# Patient Record
Sex: Female | Born: 1985 | Race: Black or African American | Hispanic: No | Marital: Single | Smoking: Former smoker
Health system: Southern US, Community
[De-identification: ages and names within clinical notes are randomized; demographics above are authoritative.]

## PROBLEM LIST (undated history)

## (undated) ENCOUNTER — Inpatient Hospital Stay: Payer: Self-pay

## (undated) DIAGNOSIS — Z789 Other specified health status: Secondary | ICD-10-CM

## (undated) DIAGNOSIS — R87629 Unspecified abnormal cytological findings in specimens from vagina: Secondary | ICD-10-CM

## (undated) HISTORY — PX: OTHER SURGICAL HISTORY: SHX169

---

## 2006-07-13 ENCOUNTER — Inpatient Hospital Stay: Payer: Self-pay | Admitting: Obstetrics and Gynecology

## 2015-01-15 ENCOUNTER — Other Ambulatory Visit: Payer: Self-pay | Admitting: Advanced Practice Midwife

## 2015-01-15 DIAGNOSIS — Z369 Encounter for antenatal screening, unspecified: Secondary | ICD-10-CM

## 2015-01-31 ENCOUNTER — Ambulatory Visit: Payer: Self-pay

## 2015-02-04 ENCOUNTER — Ambulatory Visit
Admission: RE | Admit: 2015-02-04 | Discharge: 2015-02-04 | Disposition: A | Discharge status: Home / Self Care | Payer: Medicaid Other | Source: Ambulatory Visit | Attending: Advanced Practice Midwife | Admitting: Advanced Practice Midwife

## 2015-02-04 ENCOUNTER — Ambulatory Visit
Admission: RE | Admit: 2015-02-04 | Discharge: 2015-02-04 | Disposition: A | Discharge status: Home / Self Care | Payer: Medicaid Other | Source: Ambulatory Visit | Attending: Obstetrics & Gynecology | Admitting: Obstetrics & Gynecology

## 2015-02-04 DIAGNOSIS — Z369 Encounter for antenatal screening, unspecified: Secondary | ICD-10-CM

## 2015-02-04 DIAGNOSIS — Z36 Encounter for antenatal screening of mother: Secondary | ICD-10-CM | POA: Diagnosis not present

## 2015-02-04 HISTORY — DX: Other specified health status: Z78.9

## 2015-02-04 LAB — US OB COMP LESS 14 WKS

## 2015-02-04 NOTE — Progress Notes (Addendum)
[facility] Department Length of Consultation: 30 minutes   Stephanie Cantrell  was referred to [facility] for genetic counseling to review prenatal screening and testing options.  This note summarizes the information we discussed.    First trimester screening, which can include nuchal translucency ultrasound screen and/or first trimester maternal serum marker screening.  The nuchal translucency has approximately an 80% detection rate for Down syndrome and can be positive for other chromosome abnormalities as well as congenital heart defects.  When combined with a maternal serum marker screening, the detection rate is up to 90% for Down syndrome and up to 97% for trisomy 18.     Maternal serum marker screening, a blood test that measures pregnancy proteins, can provide risk assessments for Down syndrome, trisomy 18, and open neural tube defects (spina bifida, anencephaly). Because it does not directly examine the fetus, it cannot positively diagnose or rule out these problems.  Targeted ultrasound uses high frequency sound waves to create an image of the developing fetus.  An ultrasound is often recommended as a routine means of evaluating the pregnancy.  It is also used to screen for fetal anatomy problems (for example, a heart defect) that might be suggestive of a chromosomal or other abnormality.   Should these screening tests indicate an increased concern, then the following diagnostic options would be offered:  The chorionic villus sampling procedure is available for first trimester chromosome analysis.  This involves the withdrawal of a small amount of chorionic villi (tissue from the developing placenta).  Risk of pregnancy loss is estimated to be approximately 1 in 200 to 1 in 100 (0.5 to 1%).  There is approximately a 1% (1 in 100) chance that the CVS chromosome results will be unclear.  Chorionic villi cannot be tested for neural tube defects.     Amniocentesis  involves the removal of a small amount of amniotic fluid from the sac surrounding the fetus with the use of a thin needle inserted through the maternal abdomen and uterus.  Ultrasound guidance is used throughout the procedure.  Fetal cells from amniotic fluid are directly evaluated and > 99.5% of chromosome problems and > 98% of open neural tube defects can be detected. This procedure is generally performed after the 15th week of pregnancy.  The main risks to this procedure include complications leading to miscarriage in less than 1 in 200 cases (0.5%).  As another option for information if the pregnancy is suspected to be an an increased chance for certain chromosome conditions, we also reviewed the availability of cell free fetal DNA testing from maternal blood to determine whether or not the baby may have either Down syndrome, trisomy 68, or trisomy 58.  This test utilizes a maternal blood sample and DNA sequencing technology to isolate circulating cell free fetal DNA from maternal plasma.  The fetal DNA can then be analyzed for DNA sequences that are derived from the three most common chromosomes involved in aneuploidy, chromosomes 13, 18, and 21.  If the overall amount of DNA is greater than the expected level for any of these chromosomes, aneuploidy is suspected.  This testing is commercially available, and is able to provide another means of determining the chance for one of these common chromosome conditions, without requiring an invasive procedure and traditional karyotype analysis.  While this is new technology, the testing does show great promise, and we offered it as an option.  We discussed this option with her in detail.  We explained that while we do not consider it a replacement for invasive testing and karyotype analysis, a negative result from this testing would be reassuring, though not a guarantee of a normal chromosome complement for the baby.  An abnormal result is certainly suggestive of an  abnormal chromosome complement, though we would still recommend CVS or amniocentesis to confirm any findings from this testing.   Cystic Fibrosis screening was also discussed with the patient. Cystic fibrosis (CF) is one of the most common genetic conditions in persons of Caucasian ancestry.  This condition occurs in approximately 1 in 2,500 Caucasian persons and results in thickened secretions in the lungs, digestive, and reproductive systems.  For a baby to be at risk for having CF, both of the parents must be carriers for this condition.  Approximately 1 in 20 Caucasian persons is a carrier for CF.  Current carrier testing looks for the most common mutations in the gene for CF and can detect approximately 90% of carriers in the Caucasian population.  This means that the carrier screening can greatly reduce, but cannot eliminate, the chance for an individual to have a child with CF.  If an individual is found to be a carrier for CF, then carrier testing would be available for the partner. As part of [place]'s newborn screening profile, all babies born in the state of [place] will have a two-tier screening process.  Specimens are first tested to determine the concentration of immunoreactive trypsinogen (IRT).  The top 5% of specimens with the highest IRT values then undergo DNA testing using a panel of over 40 common CF mutations.   We obtained a detailed family history and pregnancy history.  The patient stated that her maternal grandmother has breast cancer in her 52s and some of her mother's cousins also have had breast cancer.  We reviewed that most cases of breast cancer, particularly after menopause, occur by chance alone.  However, there are some families in which genetic factors for breast cancer are strong.  If the family is concerned about this history, they could consider meeting with a cancer genetic counselor.   We suggest that the patient follow her doctor's recommendations  regarding self exams and mammography.  The patient also has a maternal aunt who was born with unilateral vision loss.  There may be many reasons for blindness, including those which are inherited and those that have environmental or other causes.  If a specific cause is known, we are happy to review medical records and discuss this history further. The remainder of the family history was reported to be unremarkable for birth defects, mental retardation, recurrent pregnancy loss or known chromosome abnormalities.  Stephanie Cantrell reported no complications or exposures in this pregnancy that are expected to increase the risk for birth defects. This is her second pregnancy with this partner.  They have a healthy 56 year old daughter.  After consideration of the options, Stephanie Cantrell elected to proceed with first trimester screening.  An ultrasound was performed at the time of the visit.  The gestational age was consistent with  12 weeks.  Fetal anatomy could not be assessed due to early gestational age.  Please refer to the ultrasound report for details of that study.  Stephanie Cantrell was encouraged to call with questions or concerns.  We can be contacted at [phone].  Stephanie Anderson, MS, CGC   I was immediately available for this consult. Stephanie Cantrell, [place] A, MD

## 2015-02-11 ENCOUNTER — Telehealth: Payer: Self-pay | Admitting: Obstetrics and Gynecology

## 2015-02-11 NOTE — Telephone Encounter (Signed)
  Ms. Mackintosh elected to undergo First Trimester screening on February 04, 2015.  To review, first trimester screening, includes nuchal translucency ultrasound screen and/or first trimester maternal serum marker screening.  The nuchal translucency has approximately an 80% detection rate for Down syndrome and can be positive for other chromosome abnormalities as well as heart defects.  When combined with a maternal serum marker screening, the detection rate is up to 90% for Down syndrome and up to 97% for trisomy 13 and 18.     The results of the First Trimester Nuchal Translucency and Biochemical Screening were within normal range.  The risk for Down syndrome is now estimated to be less than 1 in 10,000.  The risk for Trisomy 13/18 is also estimated to be less than 1 in 10,000.  Should more definitive information be desired, we would offer amniocentesis.  Because we do not yet know the effectiveness of combined first and second trimester screening, we do not recommend a maternal serum screen to assess the chance for chromosome conditions.  However, if screening for neural tube defects is desired, maternal serum screening for AFP only can be performed between 15 and [redacted] weeks gestation.

## 2015-03-07 NOTE — Addendum Note (Signed)
Encounter addended by: [place] Haillee Johann, MD on: 03/07/2015  8:28 AM<BR>     Documentation filed: Notes Section

## 2015-03-18 ENCOUNTER — Ambulatory Visit
Admission: RE | Admit: 2015-03-18 | Discharge: 2015-03-18 | Disposition: A | Discharge status: Home / Self Care | Payer: Medicaid Other | Source: Ambulatory Visit | Attending: Maternal & Fetal Medicine | Admitting: Maternal & Fetal Medicine

## 2015-03-18 DIAGNOSIS — O99321 Drug use complicating pregnancy, first trimester: Secondary | ICD-10-CM | POA: Diagnosis not present

## 2015-03-18 DIAGNOSIS — Z36 Encounter for antenatal screening of mother: Secondary | ICD-10-CM | POA: Insufficient documentation

## 2015-03-18 DIAGNOSIS — Z369 Encounter for antenatal screening, unspecified: Secondary | ICD-10-CM

## 2015-03-18 DIAGNOSIS — F1290 Cannabis use, unspecified, uncomplicated: Secondary | ICD-10-CM | POA: Insufficient documentation

## 2015-03-18 DIAGNOSIS — Z3A12 12 weeks gestation of pregnancy: Secondary | ICD-10-CM | POA: Insufficient documentation

## 2015-03-18 DIAGNOSIS — O99311 Alcohol use complicating pregnancy, first trimester: Secondary | ICD-10-CM | POA: Diagnosis not present

## 2015-06-11 ENCOUNTER — Encounter: Payer: Self-pay | Admitting: Obstetrics and Gynecology

## 2015-06-11 ENCOUNTER — Ambulatory Visit (INDEPENDENT_AMBULATORY_CARE_PROVIDER_SITE_OTHER): Payer: Medicaid Other | Admitting: Obstetrics and Gynecology

## 2015-06-11 VITALS — BP 108/72 | HR 80 | Ht 64.0 in | Wt 173.4 lb

## 2015-06-11 DIAGNOSIS — Z331 Pregnant state, incidental: Secondary | ICD-10-CM

## 2015-06-11 DIAGNOSIS — R87611 Atypical squamous cells cannot exclude high grade squamous intraepithelial lesion on cytologic smear of cervix (ASC-H): Secondary | ICD-10-CM

## 2015-06-11 DIAGNOSIS — Z3493 Encounter for supervision of normal pregnancy, unspecified, third trimester: Secondary | ICD-10-CM

## 2015-06-11 NOTE — Addendum Note (Signed)
Addended by: Sharon SellerEFRANCESCO, Jayland Null on: 06/11/2015 01:27 PM   Modules accepted: Level of Service

## 2015-06-11 NOTE — Progress Notes (Signed)
Patient ID: Stephanie Cantrell, female   DOB: 02/05/1986, 29 y.o.   MRN: [id number] Refer from [facility]- colpo 01/2015- ascus cannot exclude high grade g2 p1001  30 1/7  GYN ENCOUNTER NOTE  Subjective:       Stephanie Cantrell is a 66 y.o. G57P1001 female is here for gynecologic evaluation of the following issues:  1.ASCUS/H Pap smear.     Currently, [redacted] weeks gestation.  Past history is notable for abnormal Pap smear years ago for which she underwent colposcopy at the health department; no interventions for dysplasia were performed.  Patient is monogamous.  Greater than 10 years.  She is a nonsmoker over the past 10 years. Gynecologic History Patient's last menstrual period was 11/12/2014. Contraception: Gravid Last NWG:NFAOZ/H  Obstetric History OB History  Gravida Para Term Preterm AB SAB TAB Ectopic Multiple Living  # Outcome Date GA Lbr Len/2nd Weight Sex Delivery Anes PTL Lv  2 Current           1 Term 07/13/06     Vag-Spont         Past Medical History  Diagnosis Date  . Medical history non-contributory     Past Surgical History  Procedure Laterality Date  . None      Current Outpatient Prescriptions on File Prior to Visit  Medication Sig Dispense Refill  . ferrous fumarate (HEMOCYTE - 106 MG FE) 325 (106 FE) MG TABS tablet Take 1 tablet by mouth daily.    . Prenatal Vit-Fe Fumarate-FA (PRENATAL MULTIVITAMIN) TABS tablet Take 1 tablet by mouth daily at 12 noon.     No current facility-administered medications on file prior to visit.    No Known Allergies  Social History   Social History  . Marital Status: Single    Spouse Name: N/A  . Number of Children: N/A  . Years of Education: N/A   Occupational History  . Not on file.   Social History Main Topics  . Smoking status: Former Smoker    Quit date: 02/03/2005  . Smokeless tobacco: Never Used  . Alcohol Use: No  . Drug Use: No  . Sexual Activity: Yes   Other Topics Concern  . Not on file    Social History Narrative    Family History  Problem Relation Age of Onset  . Breast cancer Maternal Grandmother   . Diabetes Neg Hx   . Heart disease Neg Hx   . Colon cancer Neg Hx   . Ovarian cancer Other     The following portions of the patient's history were reviewed and updated as appropriate: allergies, current medications, past family history, past medical history, past social history, past surgical history and problem list.  Review of Systems Review of Systems - General ROS: negative for - chills, fatigue, fever, hot flashes, malaise or night sweats Hematological and Lymphatic ROS: negative for - bleeding problems or swollen lymph nodes Gastrointestinal ROS: negative for - abdominal pain, blood in stools, change in bowel habits and nausea/vomiting Musculoskeletal ROS: negative for - joint pain, muscle pain or muscular weakness Genito-Urinary ROS: negative for - change in menstrual cycle, dysmenorrhea, dyspareunia, dysuria, genital discharge, genital ulcers, hematuria, incontinence, irregular/heavy menses, nocturia or pelvic pain.  Objective:   BP 108/72 mmHg  Pulse 80  Ht  (1.626 m)  Wt 173 lb 6.4 oz (78.654 kg)  BMI 29.75 kg/m2  LMP 11/12/2014 CONSTITUTIONAL: Well-developed, well-nourished female in no acute distress.  HENT:  Normocephalic, atraumatic.  NECK: Normal range of motion, supple, no masses.  Normal thyroid.  SKIN: Skin is warm and dry. No rash noted. Not diaphoretic. No erythema. No pallor. NEUROLGIC: Alert and oriented to person, place, and time. PSYCHIATRIC: Normal mood and affect. Normal behavior. Normal judgment and thought content. CARDIOVASCULAR:Not Examined RESPIRATORY: Not Examined BREASTS: Not Examined ABDOMEN: Soft, non distended; Non tender.  No Organomegaly.Gravid, 30 weeks. PELVIC:  External Genitalia: Normal  BUS: Normal  Vagina: Normal  Cervix: Normal  Uterus: Nontender, 30 week size  Adnexa: Normal  RV: Normal   Bladder:  Nontender MUSCULOSKELETAL: Normal range of motion. No tenderness.  No cyanosis, clubbing, or edema.  COLPOSCOPY: SCJ visualized. Isolated acetowhite epithelium is noted at the 1:00 and 11:00 positions with tongues of abnormality extending from the SCJ (see geographic mapping for details)  Pap smear is taken. No biopsies performed.    Assessment:   1. Pap smear of cervix with ASCUS, cannot exclude HGSIL 2.  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  Pap smear/HPV. 2.  Return in May 2017 for repeat colposcopy with biopsies (12 weeks status post EDC) 3.  Avoid tobacco products. 4.  Remain monogamous and use condoms if new partners are encountered.  Herold HarmsMartin A Quasim Doyon, MD  Note: This dictation was prepared with Dragon dictation along with smaller phrase technology. Any transcriptional errors that result from this process are unintentional.   Cc[facility]

## 2015-06-11 NOTE — Patient Instructions (Signed)
1.  Return in May 2017 for repeat colposcopy with biopsies.

## 2015-06-13 ENCOUNTER — Encounter: Payer: Self-pay | Admitting: Obstetrics and Gynecology

## 2015-06-19 LAB — PAP IG AND HPV HIGH-RISK
HPV, high-risk: POSITIVE — AB
PAP SMEAR COMMENT: 0

## 2015-07-07 NOTE — L&D Delivery Note (Signed)
Delivery Summary for [employer]  Labor Events:   Preterm labor:   Rupture date:   Rupture time:   Rupture type:   Fluid Color:   Induction:   Augmentation:   Complications:   Cervical ripening:          Delivery:   Episiotomy:   Lacerations:   Repair suture:   Repair # of packets:   Blood loss (ml): 150   Information for the patient's newborn:  Larita, Deremer [[id number]]    Delivery 08/23/2015 4:54 AM by  Vaginal, Spontaneous Delivery Sex:  female Gestational Age: [redacted]w[redacted]d Delivery Clinician:  Hildred Laser Living?: Yes        APGARS  One minute Five minutes Ten minutes  Skin color: 1   1      Heart rate: 2   2      Grimace: 2   2      Muscle tone: 2   2      Breathing: 2   2      Totals: 9  9      Presentation/position:      Resuscitation: None  Cord information: 3 vessels   Disposition of cord blood:     Blood gases sent?  Complications:   Placenta: Delivered: 08/23/2015 4:58 AM  Spontaneous  Intact appearance Newborn Measurements: Weight: 6 lb 4.5 oz (2849 g)  Height: 19.88"  Head circumference:    Chest circumference:    Other providers: Delivery Nurse Annie Paras  Additional  information: Forceps:   Vacuum:   Breech:   Observed anomalies       Delivery Note At 4:54 AM a viable female was delivered via Vaginal, Spontaneous Delivery (Presentation: vertex; left occiput anterior).  APGAR: 9, 9; weight 6 lb 4.5 oz (2849 g).   Placenta status: Intact, Spontaneous.  Cord: 3 vessels with the following complications: none.  Cord pH: not obtained  Anesthesia: None  Episiotomy: None Lacerations: Periurethral (1st degree, hemostatic) Suture Repair: None Est. Blood Loss (mL): 150 ml  Mom to postpartum.  Baby to Couplet care / Skin to Skin.  Hildred Laser [facility]

## 2015-07-20 ENCOUNTER — Encounter: Payer: Self-pay | Admitting: *Deleted

## 2015-07-20 ENCOUNTER — Observation Stay
Admission: EM | Admit: 2015-07-20 | Discharge: 2015-07-20 | Disposition: A | Discharge status: Home / Self Care | Payer: Medicaid Other | Attending: Obstetrics and Gynecology | Admitting: Obstetrics and Gynecology

## 2015-07-20 DIAGNOSIS — Z3A39 39 weeks gestation of pregnancy: Secondary | ICD-10-CM | POA: Diagnosis not present

## 2015-07-20 DIAGNOSIS — O368130 Decreased fetal movements, third trimester, not applicable or unspecified: Principal | ICD-10-CM | POA: Insufficient documentation

## 2015-07-20 DIAGNOSIS — N920 Excessive and frequent menstruation with regular cycle: Secondary | ICD-10-CM | POA: Diagnosis present

## 2015-07-20 HISTORY — DX: Unspecified abnormal cytological findings in specimens from vagina: R87.629

## 2015-07-20 LAB — URINALYSIS COMPLETE WITH MICROSCOPIC (ARMC ONLY)
BILIRUBIN URINE: NEGATIVE
GLUCOSE, UA: NEGATIVE mg/dL
Hgb urine dipstick: NEGATIVE
Nitrite: NEGATIVE
PH: 6 (ref 5.0–8.0)
Protein, ur: 30 mg/dL — AB
Specific Gravity, Urine: 1.028 (ref 1.005–1.030)

## 2015-07-20 NOTE — OB Triage Note (Signed)
Patient arrived via EMS, pt states she got up to us the bathroom and had light red spotting. And baby has not moved since 5 until arriving to hospital.

## 2015-07-20 NOTE — Discharge Instructions (Signed)
Drink plenty of water and get plenty of rest. Call your provider for any other concerns °

## 2015-08-12 NOTE — Final Progress Note (Signed)
L&D OB Triage Note  Kellen Hover is a 30 y.o. G2P1000 female at [redacted]w[redacted]d, EDD Estimated Date of Delivery: 08/19/15 who presented to triage for complaints of decreased fetal movement and vaginal spotting.  She was evaluated by the nurses with no significant findings.  Fetal movement appreciated while in triage.  No further vaginal bleeding appreciated. Cervix  noted to be closed on speculum exam. NoVital signs stable. An NST was performed and has been reviewed by MD.   NST INTERPRETATION: Indications: decreased fetal movement and rule out uterine contractions  Mode: External Baseline Rate (A): 125 bpm Variability: Moderate Accelerations: 15 x 15 Decelerations: None     Contraction Frequency (min): none  Impression: reactive   Plan: NST performed was reviewed and was found to be reactive. She was discharged home with bleeding/labor precautions.  Continue routine prenatal care. Follow up with OB/GYN as previously scheduled.     Hildred Laser, MD

## 2015-08-21 ENCOUNTER — Inpatient Hospital Stay
Admission: EM | Admit: 2015-08-21 | Discharge: 2015-08-21 | Disposition: A | Discharge status: Home / Self Care | Payer: Medicaid Other | Attending: Obstetrics and Gynecology | Admitting: Obstetrics and Gynecology

## 2015-08-21 DIAGNOSIS — Z3A40 40 weeks gestation of pregnancy: Secondary | ICD-10-CM | POA: Insufficient documentation

## 2015-08-21 NOTE — Final Progress Note (Signed)
L&D OB Triage Note  Stephanie Cantrell is a 30 y.o. G2P1001 female at [redacted]w[redacted]d, EDD Estimated Date of Delivery: 08/19/15 who presented to triage for complaints of contractions.  She was evaluated by the nurses with findings significant for contractions q 30-45 min. Vital signs stable. An NST was performed and has been reviewed by MD.   NST INTERPRETATION: Indications: rule out uterine contractions  Mode: External Baseline Rate (A): 125 bpm Variability: Moderate Accelerations: 15 x 15 Decelerations: None     Contraction Frequency (min): irregular  Impression: reactive   Plan: NST performed was reviewed and was found to be reactive. She was discharged home with bleeding/labor precautions.  Continue routine prenatal care. Follow up with [facility] Department as previously scheduled.    Hildred Laser, MD [facility]

## 2015-08-21 NOTE — Discharge Summary (Signed)
Patient received discharge instructions on follow up appointments, labor precautions, and when to seek medical attention. Patient ambulatory at discharge with steady gait and no complaints.

## 2015-08-23 ENCOUNTER — Encounter: Payer: Self-pay | Admitting: *Deleted

## 2015-08-23 ENCOUNTER — Inpatient Hospital Stay
Admission: EM | Admit: 2015-08-23 | Discharge: 2015-08-24 | DRG: 775 | Disposition: A | Discharge status: Home / Self Care | Payer: Medicaid Other | Attending: Obstetrics and Gynecology | Admitting: Obstetrics and Gynecology

## 2015-08-23 DIAGNOSIS — IMO0001 Reserved for inherently not codable concepts without codable children: Secondary | ICD-10-CM

## 2015-08-23 DIAGNOSIS — Z87891 Personal history of nicotine dependence: Secondary | ICD-10-CM | POA: Diagnosis not present

## 2015-08-23 DIAGNOSIS — Z3A40 40 weeks gestation of pregnancy: Secondary | ICD-10-CM | POA: Diagnosis not present

## 2015-08-23 DIAGNOSIS — O480 Post-term pregnancy: Principal | ICD-10-CM | POA: Diagnosis present

## 2015-08-23 DIAGNOSIS — Z3483 Encounter for supervision of other normal pregnancy, third trimester: Secondary | ICD-10-CM | POA: Diagnosis not present

## 2015-08-23 LAB — TYPE AND SCREEN
ABO/RH(D): O POS
Antibody Screen: NEGATIVE

## 2015-08-23 LAB — RAPID HIV SCREEN (HIV 1/2 AB+AG)
HIV 1/2 ANTIBODIES: NONREACTIVE
HIV-1 P24 ANTIGEN - HIV24: NONREACTIVE

## 2015-08-23 LAB — CBC
HEMATOCRIT: 36.6 % (ref 35.0–47.0)
HEMOGLOBIN: 11.9 g/dL — AB (ref 12.0–16.0)
MCH: 26.5 pg (ref 26.0–34.0)
MCHC: 32.4 g/dL (ref 32.0–36.0)
MCV: 81.6 fL (ref 80.0–100.0)
Platelets: 201 10*3/uL (ref 150–440)
RBC: 4.49 MIL/uL (ref 3.80–5.20)
RDW: 12.9 % (ref 11.5–14.5)
WBC: 14.5 10*3/uL — ABNORMAL HIGH (ref 3.6–11.0)

## 2015-08-23 LAB — ABO/RH: ABO/RH(D): O POS

## 2015-08-23 MED ORDER — ONDANSETRON HCL 4 MG/2ML IJ SOLN: 4.0000 mg | INTRAMUSCULAR | Status: DC | PRN

## 2015-08-23 MED ORDER — LIDOCAINE HCL (PF) 1 % IJ SOLN
INTRAMUSCULAR | Status: AC
  Filled 2015-08-23: qty 30

## 2015-08-23 MED ORDER — BENZOCAINE-MENTHOL 20-0.5 % EX AERO
1.0000 "application " | INHALATION_SPRAY | CUTANEOUS | Status: DC | PRN
  Administered 2015-08-23: 1 via TOPICAL
  Filled 2015-08-23: qty 56

## 2015-08-23 MED ORDER — OXYTOCIN 40 UNITS IN LACTATED RINGERS INFUSION - SIMPLE MED: 2.5000 [IU]/h | INTRAVENOUS | Status: DC

## 2015-08-23 MED ORDER — ACETAMINOPHEN-CODEINE #3 300-30 MG PO TABS: 1.0000 | ORAL_TABLET | Freq: Four times a day (QID) | ORAL | Status: DC | PRN

## 2015-08-23 MED ORDER — OXYCODONE-ACETAMINOPHEN 5-325 MG PO TABS: 1.0000 | ORAL_TABLET | ORAL | Status: DC | PRN

## 2015-08-23 MED ORDER — LACTATED RINGERS IV SOLN: 500.0000 mL | INTRAVENOUS | Status: DC | PRN

## 2015-08-23 MED ORDER — ACETAMINOPHEN 325 MG PO TABS: 650.0000 mg | ORAL_TABLET | ORAL | Status: DC | PRN

## 2015-08-23 MED ORDER — ONDANSETRON HCL 4 MG PO TABS: 4.0000 mg | ORAL_TABLET | ORAL | Status: DC | PRN

## 2015-08-23 MED ORDER — LANOLIN HYDROUS EX OINT: TOPICAL_OINTMENT | CUTANEOUS | Status: DC | PRN

## 2015-08-23 MED ORDER — SENNOSIDES-DOCUSATE SODIUM 8.6-50 MG PO TABS
2.0000 | ORAL_TABLET | ORAL | Status: DC
  Administered 2015-08-23: 2 via ORAL
  Filled 2015-08-23: qty 2

## 2015-08-23 MED ORDER — MISOPROSTOL 200 MCG PO TABS
ORAL_TABLET | ORAL | Status: AC
  Filled 2015-08-23: qty 4

## 2015-08-23 MED ORDER — OXYTOCIN BOLUS FROM INFUSION: 500.0000 mL | INTRAVENOUS | Status: DC

## 2015-08-23 MED ORDER — LIDOCAINE HCL (PF) 1 % IJ SOLN: 30.0000 mL | INTRAMUSCULAR | Status: DC | PRN

## 2015-08-23 MED ORDER — DIBUCAINE 1 % RE OINT: 1.0000 "application " | TOPICAL_OINTMENT | RECTAL | Status: DC | PRN

## 2015-08-23 MED ORDER — ONDANSETRON HCL 4 MG/2ML IJ SOLN: 4.0000 mg | Freq: Four times a day (QID) | INTRAMUSCULAR | Status: DC | PRN

## 2015-08-23 MED ORDER — IBUPROFEN 600 MG PO TABS
600.0000 mg | ORAL_TABLET | Freq: Four times a day (QID) | ORAL | Status: DC
  Administered 2015-08-23 – 2015-08-24 (×5): 600 mg via ORAL
  Filled 2015-08-23 (×5): qty 1

## 2015-08-23 MED ORDER — PRENATAL MULTIVITAMIN CH
1.0000 | ORAL_TABLET | Freq: Every day | ORAL | Status: DC
  Administered 2015-08-23 – 2015-08-24 (×2): 1 via ORAL
  Filled 2015-08-23 (×2): qty 1

## 2015-08-23 MED ORDER — SIMETHICONE 80 MG PO CHEW: 80.0000 mg | CHEWABLE_TABLET | ORAL | Status: DC | PRN

## 2015-08-23 MED ORDER — CITRIC ACID-SODIUM CITRATE 334-500 MG/5ML PO SOLN: 30.0000 mL | ORAL | Status: DC | PRN

## 2015-08-23 MED ORDER — BUTORPHANOL TARTRATE 1 MG/ML IJ SOLN: 1.0000 mg | INTRAMUSCULAR | Status: DC | PRN

## 2015-08-23 MED ORDER — ZOLPIDEM TARTRATE 5 MG PO TABS: 5.0000 mg | ORAL_TABLET | Freq: Every evening | ORAL | Status: DC | PRN

## 2015-08-23 MED ORDER — DIPHENHYDRAMINE HCL 25 MG PO CAPS: 25.0000 mg | ORAL_CAPSULE | Freq: Four times a day (QID) | ORAL | Status: DC | PRN

## 2015-08-23 MED ORDER — LACTATED RINGERS IV SOLN: INTRAVENOUS | Status: DC

## 2015-08-23 MED ORDER — OXYCODONE-ACETAMINOPHEN 5-325 MG PO TABS: 2.0000 | ORAL_TABLET | ORAL | Status: DC | PRN

## 2015-08-23 MED ORDER — AMMONIA AROMATIC IN INHA
RESPIRATORY_TRACT | Status: AC
  Filled 2015-08-23: qty 10

## 2015-08-23 MED ORDER — OXYTOCIN 10 UNIT/ML IJ SOLN
INTRAMUSCULAR | Status: AC
  Filled 2015-08-23: qty 2

## 2015-08-23 MED ORDER — WITCH HAZEL-GLYCERIN EX PADS: 1.0000 "application " | MEDICATED_PAD | CUTANEOUS | Status: DC | PRN

## 2015-08-23 NOTE — H&P (Signed)
Obstetric History and Physical  Stephanie Cantrell is a 30 y.o. G3P1011with IUP at [redacted]w[redacted]d presenting for contractions. Patient states she has been having  regular, every 2-3 minutes contractions, none vaginal bleeding, intact membranes, with active fetal movement.    Prenatal Course Source of Care: [facility] Department with onset of care at 9 weeks Pregnancy complications or risks: Patient Active Problem List   Diagnosis Date Noted  . Active labor 08/23/2015  . Spotting 07/20/2015  . Pap smear of cervix with ASCUS, cannot exclude HGSIL 06/11/2015   She plans to bottle feed She desires oral contraceptives (estrogen/progesterone) for postpartum contraception.   Prenatal labs and studies: ABO, Rh:  O Positive (01/16/2015) Antibody:  Negative  (01/16/2015) Rubella:  Unknown RPR:   Non-reactive  (01/16/2015) HBsAg:   Negative  (01/16/2015) HIV:   Non-reactive  (01/16/2015) GBS: Negative  (01/16/2015) 1 hr Glucola  Normal -119  Genetic screening normal Anatomy US normal  Prenatal Transfer Tool  Maternal Diabetes: No Genetic Screening: Normal Maternal Ultrasounds/Referrals: Normal Fetal Ultrasounds or other Referrals:  None Maternal Substance Abuse:  Yes:  Type: Marijuana, Other: Alcohol (cessation noted at onset of pregnancy) Significant Maternal Medications:  None Significant Maternal Lab Results: Lab values include: Group B Strep negative    OB History  Gravida Para Term Preterm AB SAB TAB Ectopic Multiple Living  # Outcome Date GA Lbr Len/2nd Weight Sex Delivery Anes PTL Lv  3 Current           2 Term 07/13/06 [redacted]w[redacted]d  6 lb 12 oz (3.062 kg) F Vag-Spont None N Y  1 TAB 03/10/05 [redacted]w[redacted]d            Past Medical History  Diagnosis Date  . Medical history non-contributory   . Vaginal Pap smear, abnormal     Past Surgical History  Procedure Laterality Date  . None     Family History  Problem Relation Age of Onset  . Breast cancer Maternal  Grandmother   . Diabetes Neg Hx   . Heart disease Neg Hx   . Colon cancer Neg Hx   . Ovarian cancer Other     Social History   Social History  . Marital Status: Single    Spouse Name: N/A  . Number of Children: N/A  . Years of Education: N/A   Social History Main Topics  . Smoking status: Former Smoker    Quit date: 02/03/2005  . Smokeless tobacco: Never Used  . Alcohol Use: No  . Drug Use: No  . Sexual Activity: Yes   Other Topics Concern  . None   Social History Narrative    Prescriptions prior to admission  Medication Sig Dispense Refill Last Dose  . ferrous fumarate (HEMOCYTE - 106 MG FE) 325 (106 FE) MG TABS tablet Take 1 tablet by mouth daily.   08/22/2015 at Unknown time  . Prenatal Vit-Fe Fumarate-FA (PRENATAL MULTIVITAMIN) TABS tablet Take 1 tablet by mouth daily at 12 noon.   08/22/2015 at Unknown time    No Known Allergies  Review of Systems: Negative except for what is mentioned in HPI.  Physical Exam: BP 114/74 mmHg  Pulse 81  Temp(Src) 98.6 F (37 C) (Oral)  Resp 16  Ht  (1.626 m)  Wt 177 lb (80.287 kg)  BMI 30.37 kg/m2  LMP 11/12/2014 CONSTITUTIONAL: Well-developed, well-nourished female in no acute distress.  HENT:  Normocephalic, atraumatic, External right  and left ear normal. Oropharynx is clear and moist EYES: Conjunctivae and EOM are normal. Pupils are equal, round, and reactive to light. No scleral icterus.  NECK: Normal range of motion, supple, no masses SKIN: Skin is warm and dry. No rash noted. Not diaphoretic. No erythema. No pallor. NEUROLOGIC: Alert and oriented to person, place, and time. Normal reflexes, muscle tone coordination. No cranial nerve deficit noted. PSYCHIATRIC: Normal mood and affect. Normal behavior. Normal judgment and thought content. CARDIOVASCULAR: Normal heart rate noted, regular rhythm RESPIRATORY: Effort and breath sounds normal, no problems with respiration noted ABDOMEN: Soft, nontender, nondistended,  gravid. MUSCULOSKELETAL: Normal range of motion. No edema and no tenderness. 2+ distal pulses.  Cervical Exam: Dilatation 7 cm   Effacement 80%   Station 0  Presentation: cephalic FHT:  Baseline rate 125 bpm   Variability moderate  Accelerations present   Decelerations none Contractions: Every 2 mins   Pertinent Labs/Studies:   Results for orders placed or performed during the hospital encounter of 08/23/15 (from the past 24 hour(s))  CBC     Status: Abnormal   Collection Time: 08/23/15  4:02 AM  Result Value Ref Range   WBC 14.5 (H) 3.6 - 11.0 K/uL   RBC 4.49 3.80 - 5.20 MIL/uL   Hemoglobin 11.9 (L) 12.0 - 16.0 g/dL   HCT 16.1 09.6 - 04.5 %   MCV 81.6 80.0 - 100.0 fL   MCH 26.5 26.0 - 34.0 pg   MCHC 32.4 32.0 - 36.0 g/dL   RDW 40.9 81.1 - 91.4 %   Platelets 201 150 - 440 K/uL    Assessment : Stephanie Cantrell is a 26 y.o. G2P1000 at [redacted]w[redacted]d being admitted for labor.  Plan: Labor: Expectant management.  Induction/Augmentation as needed, per protocol FWB: Reassuring fetal heart tracing.  GBS negative Delivery plan: Hopeful for vaginal delivery   Hildred Laser, MD [facility] 08/23/2015 5:26 AM

## 2015-08-24 LAB — CBC
HCT: 36.2 % (ref 35.0–47.0)
HEMOGLOBIN: 11.6 g/dL — AB (ref 12.0–16.0)
MCH: 26.8 pg (ref 26.0–34.0)
MCHC: 32.1 g/dL (ref 32.0–36.0)
MCV: 83.5 fL (ref 80.0–100.0)
PLATELETS: 196 10*3/uL (ref 150–440)
RBC: 4.33 MIL/uL (ref 3.80–5.20)
RDW: 13.2 % (ref 11.5–14.5)
WBC: 12.8 10*3/uL — AB (ref 3.6–11.0)

## 2015-08-24 LAB — RPR: RPR: NONREACTIVE

## 2015-08-24 LAB — RUBELLA SCREEN: RUBELLA: 1.46 {index} (ref >0.99)

## 2015-08-24 LAB — VARICELLA ZOSTER ANTIBODY, IGG: VARICELLA IGG: 2029 {index} (ref >165)

## 2015-08-24 MED ORDER — IBUPROFEN 800 MG PO TABS: 800.0000 mg | ORAL_TABLET | Freq: Three times a day (TID) | ORAL | Status: DC | PRN

## 2015-08-24 MED ORDER — DOCUSATE SODIUM 100 MG PO CAPS: 100.0000 mg | ORAL_CAPSULE | Freq: Two times a day (BID) | ORAL | Status: AC | PRN

## 2015-08-24 NOTE — Discharge Instructions (Signed)
General Postpartum Discharge Instructions ° °Do not drink alcohol or take tranquilizers.  °Do not take medicine that has not been prescribed by your doctor.  °Take showers instead of baths until your doctor gives you permission to take baths.  °No sexual intercourse or placement of anything in the vagina for 6 weeks or as instructed by your doctor. °Only take prescription or over-the-counter medicines  for pain, discomfort, or fever as directed by your doctor. Take medicines (antibiotics) that kill germs if they are prescribed for you. °  °Call the office or go to the Emergency Room if:  °You feel sick to your stomach (nauseous).  °You start to throw up (vomit).  °You have trouble eating or drinking.  °You have an oral temperature above 101.  °You have constipation that is not helped by adjusting diet or increasing fluid intake. Pain medicines are a common cause of constipation.  °You have foul smelling vaginal discharge or odor.  °You have bleeding requiring changing more than 1 pad per hour. °You have any other concerns. ° °SEEK IMMEDIATE MEDICAL CARE IF:  °You have persistent dizziness.  °You have difficulty breathing or shortness of breath.  °You have an oral temperature above 102.5, not controlled by medicine.  ° ° °Call your doctor for increased pain or vaginal bleeding, temperature above 100.4, depression, or concerns.  No strenuous activity or heavy lifting for 6 weeks.  No intercourse, tampons, douching, or enemas for 6 weeks.  No tub baths-showers only.  No driving for 2 weeks or while taking pain medications.  Continue prenatal vitamin and iron.   °

## 2015-08-24 NOTE — Progress Notes (Signed)
Post Partum Day #1 s/p SVD  Subjective: no complaints, up ad lib, voiding, tolerating PO and + flatus  Objective: Temp:  [97.5 F (36.4 C)-98.8 F (37.1 C)] 98.4 F (36.9 C) (02/18 0421) Pulse Rate:  [69-96] 72 (02/18 0421) Resp:  [18-20] 18 (02/18 0421) BP: (101-113)/(52-73) 113/73 mmHg (02/18 0421) SpO2:  [99 %] 99 % (02/17 1539)  Physical Exam:  General: alert and no distress  Lungs: clear to auscultation bilaterally Breasts: normal appearance, no masses or tenderness Heart: regular rate and rhythm, S1, S2 normal, no murmur, click, rub or gallop Lochia: appropriate Uterine Fundus: firm Incision: None DVT Evaluation: No evidence of DVT seen on physical exam. No cords or calf tenderness. No significant calf/ankle edema.   Recent Labs  08/23/15 0402 08/24/15 0647  HGB 11.9* 11.6*  HCT 36.6 36.2    Assessment/Plan: Discharge home and Contraception OCPs  Bottle feeding.  Discussed benefits of breastfeeding.    LOS: 1 day   Hildred Laser 08/24/2015, 7:33 AM

## 2015-08-24 NOTE — Progress Notes (Signed)
Prenatal records indicate that pt received TDaP vaccine on 06/05/15 and Influenza vaccine on 06/26/15. Reynold Bowen, RN 08/24/2015 11:28 AM

## 2015-08-24 NOTE — Discharge Summary (Addendum)
Obstetric Discharge Summary Reason for Admission: onset of labor Prenatal Procedures: ultrasound Intrapartum Procedures: spontaneous vaginal delivery Postpartum Procedures: none Complications-Operative and Postpartum: vaginal laceration (left 1st degree periurethral, hemostatic) HEMOGLOBIN  Date Value Ref Range Status  08/24/2015 11.6* 12.0 - 16.0 g/dL Final   HCT  Date Value Ref Range Status  08/24/2015 36.2 35.0 - 47.0 % Final    Physical Exam:  General: alert and no distress Lochia: appropriate Uterine Fundus: firm Incision: None DVT Evaluation: No evidence of DVT seen on physical exam. No cords or calf tenderness. No significant calf/ankle edema.  Discharge Diagnoses: Term Pregnancy-delivered  Discharge Information: Date: 08/24/2015 Activity: pelvic rest x 6 weeks Diet: routine Medications: PNV, Ibuprofen, Colace and Iron Condition: stable Instructions: refer to practice specific booklet Discharge to: home Follow-up Information    Follow up with [facility] In 6 weeks.   Why:  Postpartum visit   Contact information:   [address] [place] [address] [phone]       Newborn Data: Live born female  Birth Weight: 6 lb 4.5 oz (2849 g) APGAR: 9, 9.   Home with mother.  Hildred Laser 08/24/2015, 7:26 AM

## 2015-08-24 NOTE — Progress Notes (Signed)
Discharge instructions provided.  Pt and sig other verbalize understanding of all instructions and follow-up care.  Prescriptions given.  Pt discharged to home with infant at 1415 on 08/24/15 via wheelchair by RN. Reynold Bowen, RN 08/24/2015 5:04 PM

## 2015-11-19 ENCOUNTER — Encounter: Payer: Medicaid Other | Admitting: Obstetrics and Gynecology

## 2016-05-07 IMAGING — US US MFM OB DETAIL+14 WK
1 series · 14 of 28 positions shown · non-contrast
Comparison: none

[Series 1001: us mfm ob detail+14 wk · 0.28mm/px · 14 of 278 slices shown]
[im 11/278]
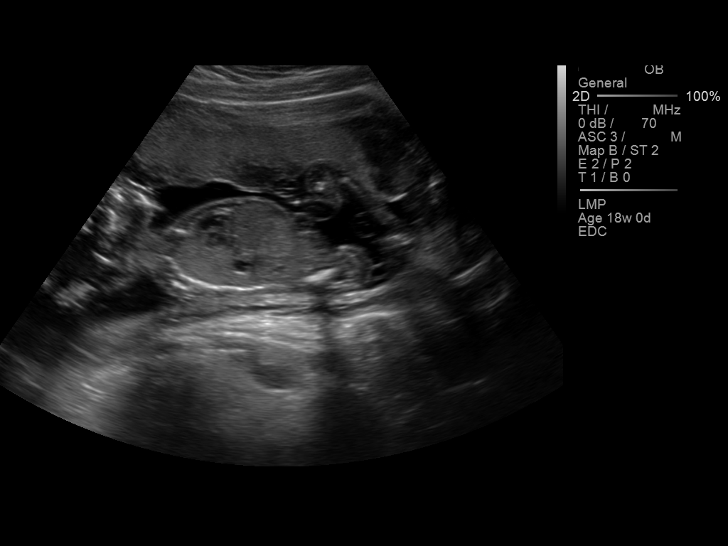
[im 31/278]
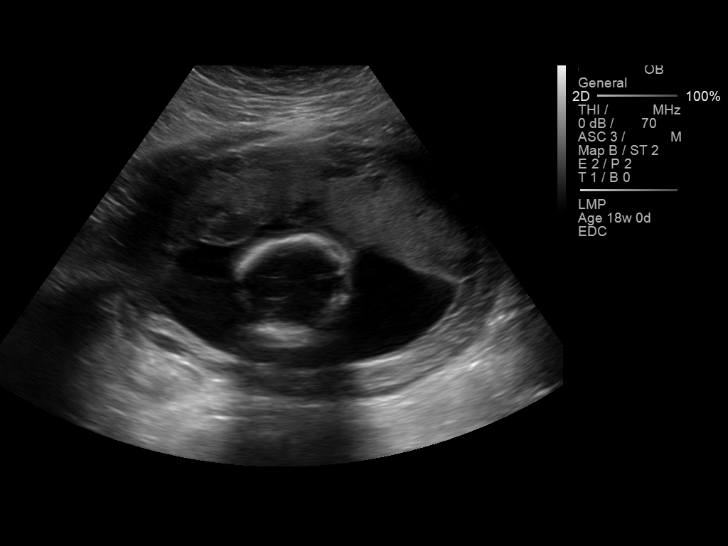
[im 52/278]
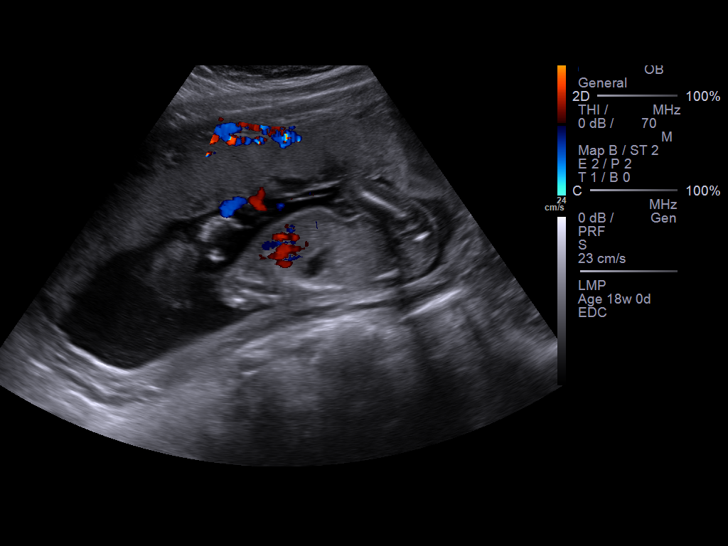
[im 72/278]
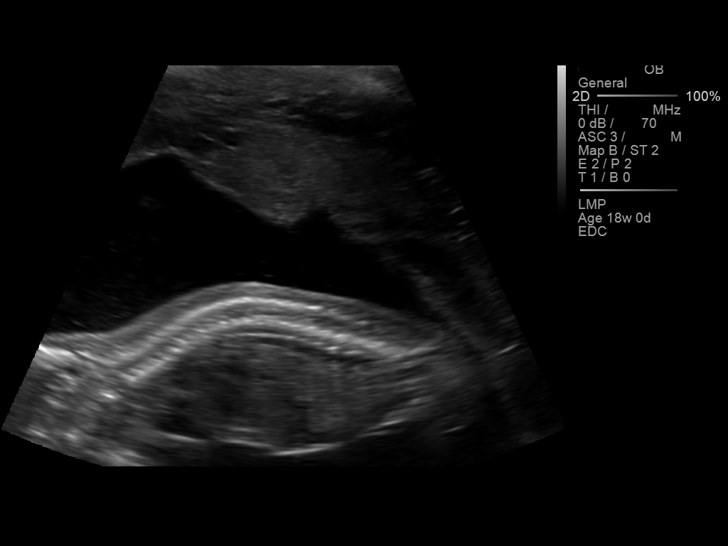
[im 93/278]
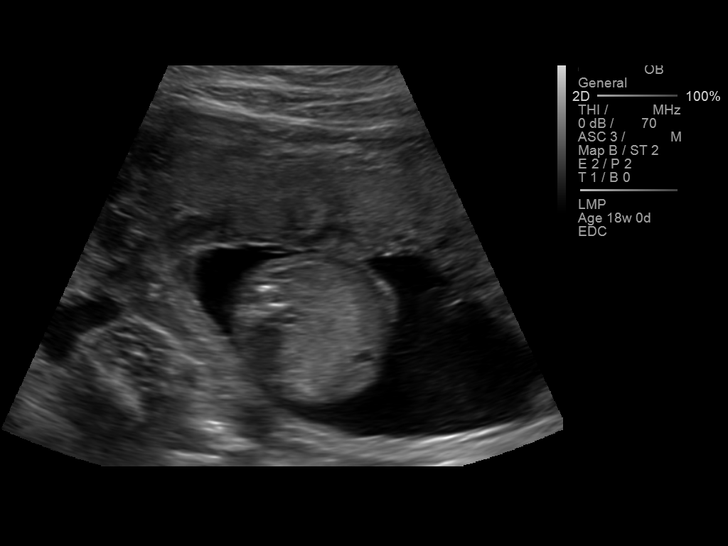
[im 113/278]
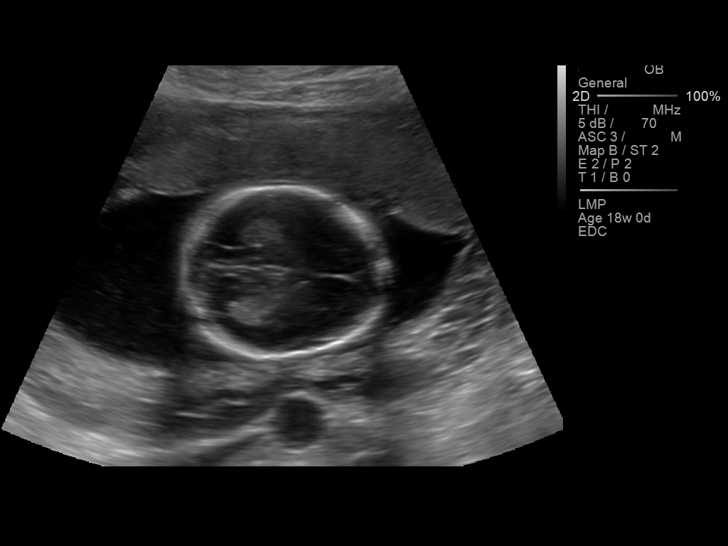
[im 134/278]
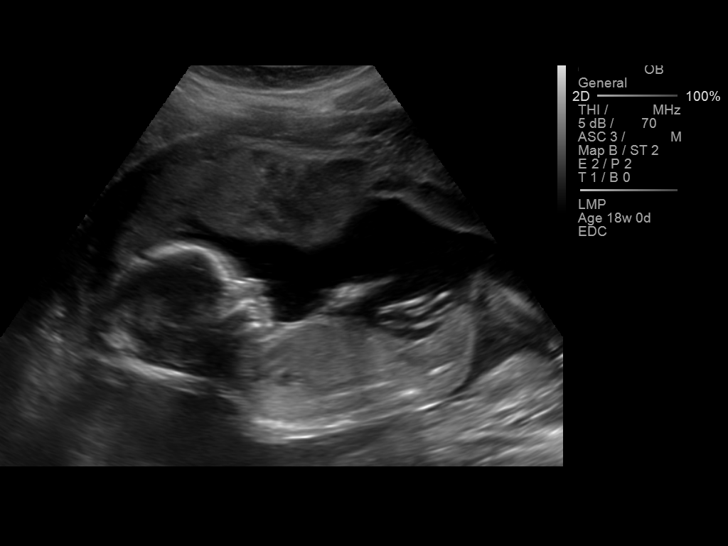
[im 154/278]
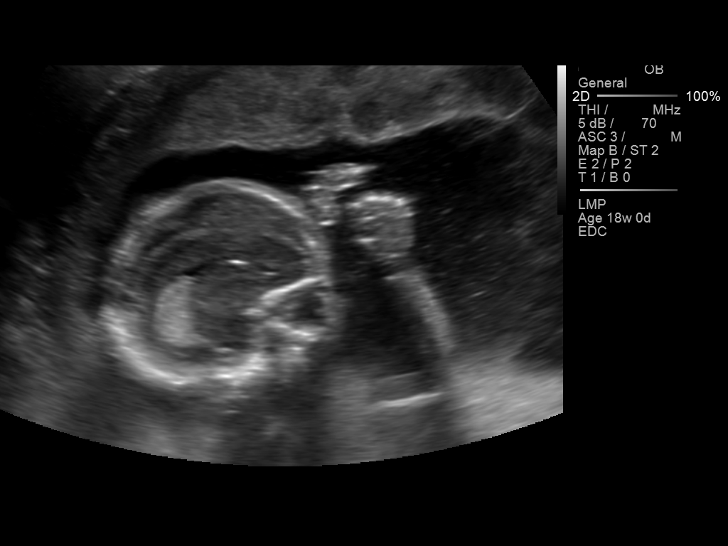
[im 175/278]
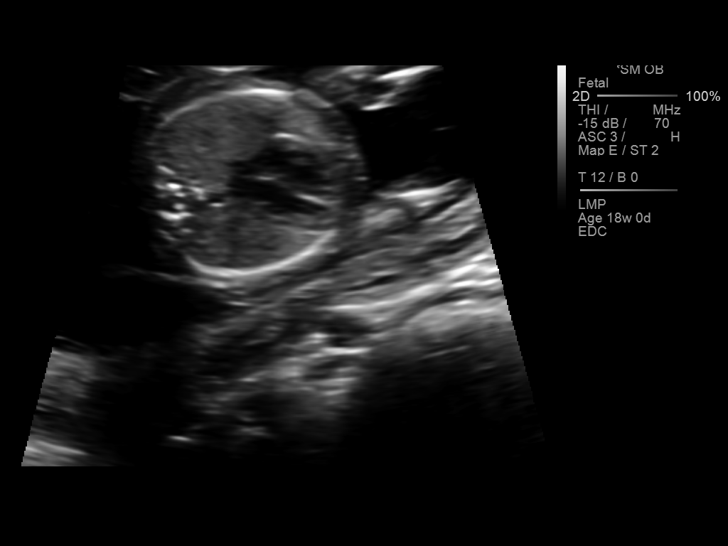
[im 195/278]
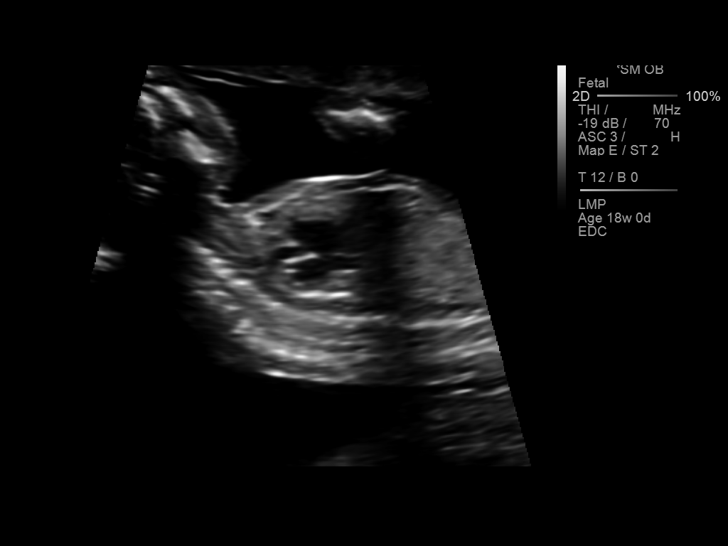
[im 216/278]
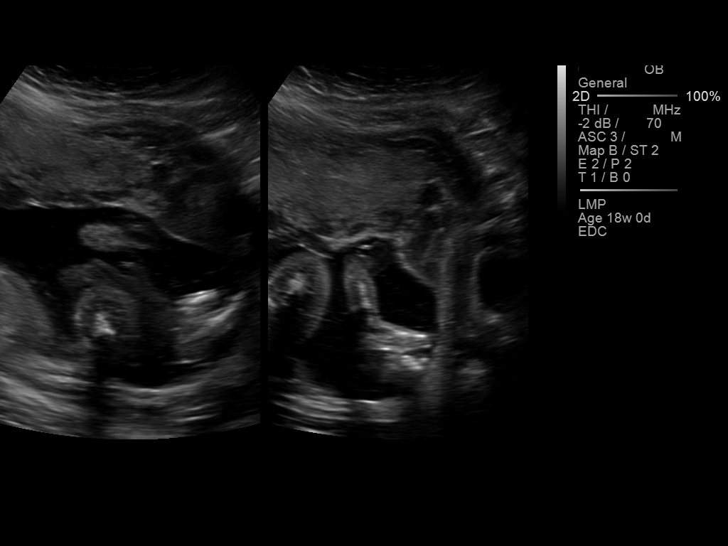
[im 236/278]
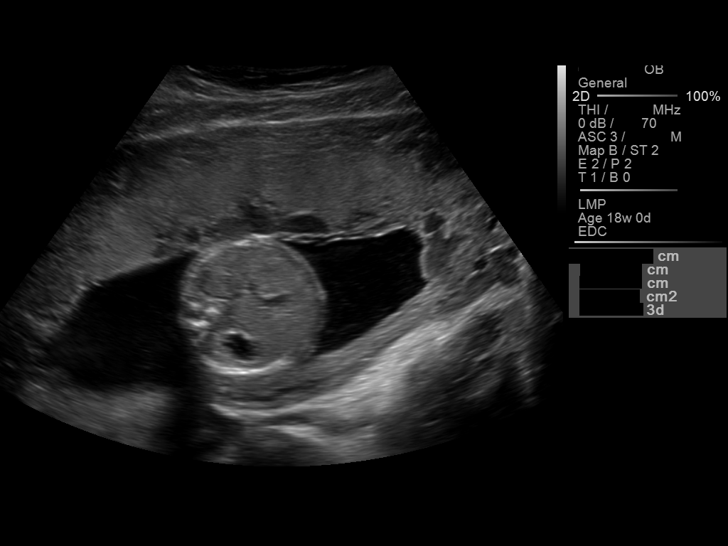
[im 257/278]
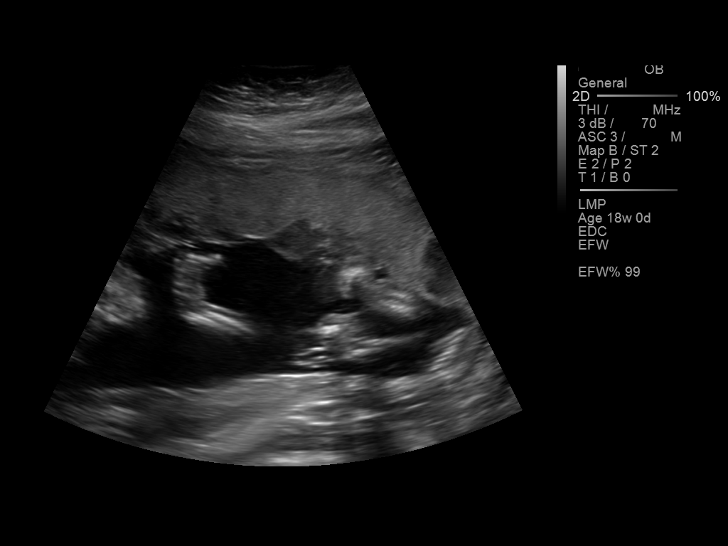
[im 278/278]
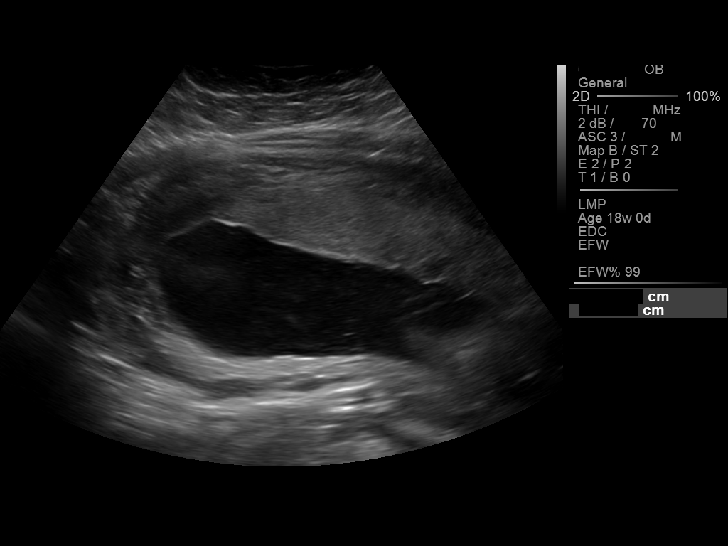

[14 of 28 positions shown; findings below may reference images not displayed]

Canned report from images found in remote index.

Refer to host system for actual result text.

## 2016-07-06 NOTE — L&D Delivery Note (Addendum)
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 Vtx visible after a few pushes. Vtx delivered with CAN x 1 reduced, rotated to LOA with Ant and post shoulder and body following with delivery at 0546. Baby to mom's abd. Cord clamped after stimulating baby and baby cried but, needed to go to warmer for assessment. Cord cut per dad. Baby began to cry when cord was cut. Cord blood collected. 3 VC noted. SDOP intact. FF and lochia scant. EBL 100 ml's. No needles and no sponges needed. Area of the introital opening was edematous approx 2 cms with fissuring which pt did not want sutured. Ice to be applied. Bonding well with baby. Pt had formerly wanted a BTL but, requested to do Depo Provera.  Baby: Liveborn female, Apgars 8/9, weight 7 #, 15 oz, baby named "Stephanie Cantrell" Stephanie Pimplearon W. Stephanie Love, RN, MSN, CNM, FNP

## 2017-06-12 ENCOUNTER — Observation Stay
Admission: EM | Admit: 2017-06-12 | Discharge: 2017-06-12 | Disposition: A | Discharge status: Home / Self Care | Payer: Medicaid Other | Source: Home / Self Care | Admitting: Obstetrics & Gynecology

## 2017-06-12 DIAGNOSIS — Z3A40 40 weeks gestation of pregnancy: Secondary | ICD-10-CM | POA: Insufficient documentation

## 2017-06-12 DIAGNOSIS — O471 False labor at or after 37 completed weeks of gestation: Secondary | ICD-10-CM | POA: Insufficient documentation

## 2017-06-12 NOTE — OB Triage Note (Signed)
Patient presents to triage for complaints of contractions every 5 mins apart since Friday am. She denies LOF but has been having a mucous discharge. No bleeding,. No other complaints..Marland Kitchen

## 2017-06-13 ENCOUNTER — Other Ambulatory Visit: Payer: Self-pay

## 2017-06-13 ENCOUNTER — Inpatient Hospital Stay
Admission: EM | Admit: 2017-06-13 | Discharge: 2017-06-15 | DRG: 806 | Disposition: A | Discharge status: Home / Self Care | Payer: Medicaid Other | Attending: Obstetrics and Gynecology | Admitting: Obstetrics and Gynecology

## 2017-06-13 DIAGNOSIS — Z3A40 40 weeks gestation of pregnancy: Secondary | ICD-10-CM | POA: Diagnosis not present

## 2017-06-13 DIAGNOSIS — Z3483 Encounter for supervision of other normal pregnancy, third trimester: Secondary | ICD-10-CM | POA: Diagnosis present

## 2017-06-13 DIAGNOSIS — Z87891 Personal history of nicotine dependence: Secondary | ICD-10-CM

## 2017-06-13 DIAGNOSIS — O99324 Drug use complicating childbirth: Secondary | ICD-10-CM | POA: Diagnosis present

## 2017-06-13 DIAGNOSIS — O99314 Alcohol use complicating childbirth: Secondary | ICD-10-CM | POA: Diagnosis present

## 2017-06-13 DIAGNOSIS — F1290 Cannabis use, unspecified, uncomplicated: Secondary | ICD-10-CM | POA: Diagnosis present

## 2017-06-13 LAB — TYPE AND SCREEN
ABO/RH(D): O POS
Antibody Screen: NEGATIVE

## 2017-06-13 LAB — CBC
HEMATOCRIT: 38.6 % (ref 35.0–47.0)
Hemoglobin: 12.4 g/dL (ref 12.0–16.0)
MCH: 26.6 pg (ref 26.0–34.0)
MCHC: 32.2 g/dL (ref 32.0–36.0)
MCV: 82.6 fL (ref 80.0–100.0)
Platelets: 205 10*3/uL (ref 150–440)
RBC: 4.67 MIL/uL (ref 3.80–5.20)
RDW: 13.2 % (ref 11.5–14.5)
WBC: 14.9 10*3/uL — AB (ref 3.6–11.0)

## 2017-06-13 MED ORDER — SODIUM CHLORIDE 0.9 % IV SOLN
250.0000 mL | INTRAVENOUS | Status: DC | PRN
Start: 2017-06-13 — End: 2017-06-14

## 2017-06-13 MED ORDER — LIDOCAINE HCL (PF) 1 % IJ SOLN
INTRAMUSCULAR | Status: AC
Start: 2017-06-13 — End: 2017-06-13
  Filled 2017-06-13: qty 30

## 2017-06-13 MED ORDER — DIBUCAINE 1 % RE OINT
1.0000 "application " | TOPICAL_OINTMENT | RECTAL | Status: DC | PRN
  Filled 2017-06-13: qty 28

## 2017-06-13 MED ORDER — DIPHENHYDRAMINE HCL 25 MG PO CAPS: 25.0000 mg | ORAL_CAPSULE | Freq: Four times a day (QID) | ORAL | Status: DC | PRN

## 2017-06-13 MED ORDER — PRENATAL MULTIVITAMIN CH
1.0000 | ORAL_TABLET | Freq: Every day | ORAL | Status: DC
  Administered 2017-06-13 – 2017-06-15 (×3): 1 via ORAL
  Filled 2017-06-13 (×3): qty 1

## 2017-06-13 MED ORDER — SENNOSIDES-DOCUSATE SODIUM 8.6-50 MG PO TABS
2.0000 | ORAL_TABLET | ORAL | Status: DC
  Administered 2017-06-14 – 2017-06-15 (×2): 2 via ORAL
  Filled 2017-06-13 (×2): qty 2

## 2017-06-13 MED ORDER — AMMONIA AROMATIC IN INHA
RESPIRATORY_TRACT | Status: AC
  Filled 2017-06-13: qty 10

## 2017-06-13 MED ORDER — ACETAMINOPHEN 325 MG PO TABS
650.0000 mg | ORAL_TABLET | ORAL | Status: DC | PRN
  Administered 2017-06-13 (×2): 650 mg via ORAL
  Filled 2017-06-13 (×2): qty 2

## 2017-06-13 MED ORDER — IBUPROFEN 600 MG PO TABS
ORAL_TABLET | ORAL | Status: AC
  Administered 2017-06-13: 18:00:00
  Filled 2017-06-13: qty 1

## 2017-06-13 MED ORDER — ONDANSETRON HCL 4 MG PO TABS: 4.0000 mg | ORAL_TABLET | ORAL | Status: DC | PRN

## 2017-06-13 MED ORDER — WITCH HAZEL-GLYCERIN EX PADS
1.0000 "application " | MEDICATED_PAD | CUTANEOUS | Status: DC | PRN
  Filled 2017-06-13: qty 100

## 2017-06-13 MED ORDER — ONDANSETRON HCL 4 MG/2ML IJ SOLN: 4.0000 mg | INTRAMUSCULAR | Status: DC | PRN

## 2017-06-13 MED ORDER — BUTORPHANOL TARTRATE 1 MG/ML IJ SOLN
1.0000 mg | INTRAMUSCULAR | Status: DC | PRN
  Administered 2017-06-13 (×2): 1 mg via INTRAVENOUS
  Filled 2017-06-13 (×2): qty 1

## 2017-06-13 MED ORDER — SIMETHICONE 80 MG PO CHEW: 80.0000 mg | CHEWABLE_TABLET | ORAL | Status: DC | PRN

## 2017-06-13 MED ORDER — OXYTOCIN 40 UNITS IN LACTATED RINGERS INFUSION - SIMPLE MED
INTRAVENOUS | Status: AC
  Administered 2017-06-13: 06:00:00
  Filled 2017-06-13: qty 1000

## 2017-06-13 MED ORDER — MEASLES, MUMPS & RUBELLA VAC ~~LOC~~ INJ
0.5000 mL | INJECTION | Freq: Once | SUBCUTANEOUS | Status: DC
  Filled 2017-06-13: qty 0.5

## 2017-06-13 MED ORDER — SODIUM CHLORIDE 0.9% FLUSH: 3.0000 mL | Freq: Two times a day (BID) | INTRAVENOUS | Status: DC

## 2017-06-13 MED ORDER — ACETAMINOPHEN 325 MG PO TABS
ORAL_TABLET | ORAL | Status: AC
  Filled 2017-06-13: qty 2

## 2017-06-13 MED ORDER — OXYTOCIN 10 UNIT/ML IJ SOLN
INTRAMUSCULAR | Status: AC
  Filled 2017-06-13: qty 2

## 2017-06-13 MED ORDER — COCONUT OIL OIL: 1.0000 "application " | TOPICAL_OIL | Status: DC | PRN

## 2017-06-13 MED ORDER — LACTATED RINGERS IV SOLN
INTRAVENOUS | Status: DC
  Administered 2017-06-13: 05:00:00 via INTRAVENOUS

## 2017-06-13 MED ORDER — MISOPROSTOL 200 MCG PO TABS
ORAL_TABLET | ORAL | Status: AC
  Filled 2017-06-13: qty 4

## 2017-06-13 MED ORDER — ZOLPIDEM TARTRATE 5 MG PO TABS: 5.0000 mg | ORAL_TABLET | Freq: Every evening | ORAL | Status: DC | PRN

## 2017-06-13 MED ORDER — SODIUM CHLORIDE 0.9% FLUSH: 3.0000 mL | INTRAVENOUS | Status: DC | PRN

## 2017-06-13 MED ORDER — BENZOCAINE-MENTHOL 20-0.5 % EX AERO: 1.0000 "application " | INHALATION_SPRAY | CUTANEOUS | Status: DC | PRN

## 2017-06-13 MED ORDER — IBUPROFEN 600 MG PO TABS
600.0000 mg | ORAL_TABLET | Freq: Four times a day (QID) | ORAL | Status: DC
  Administered 2017-06-13 – 2017-06-15 (×9): 600 mg via ORAL
  Filled 2017-06-13 (×9): qty 1

## 2017-06-13 MED ORDER — FLEET ENEMA 7-19 GM/118ML RE ENEM: 1.0000 | ENEMA | Freq: Every day | RECTAL | Status: DC | PRN

## 2017-06-13 MED ORDER — BISACODYL 10 MG RE SUPP
10.0000 mg | Freq: Every day | RECTAL | Status: DC | PRN
  Filled 2017-06-13: qty 1

## 2017-06-13 MED ORDER — OXYCODONE HCL 5 MG PO TABS
5.0000 mg | ORAL_TABLET | ORAL | Status: DC | PRN
  Administered 2017-06-13 (×2): 5 mg via ORAL
  Filled 2017-06-13 (×2): qty 1

## 2017-06-13 MED ORDER — TETANUS-DIPHTH-ACELL PERTUSSIS 5-2.5-18.5 LF-MCG/0.5 IM SUSP: 0.5000 mL | Freq: Once | INTRAMUSCULAR | Status: DC

## 2017-06-13 NOTE — Progress Notes (Addendum)
Donnie Mesaatesa Magouirk is a 31 y.o. [id number] at 2231w1d by dating and C/W US admitted for active labor pattern and SROM at 1800. PNC at [facility].   Subjective: "I do not want an epidural"  Objective: BP 130/84   Pulse 81   Temp 98.4 F (36.9 C) (Oral)   Resp 16   Ht 5\' 5"  (1.651 m)   Wt 77.1 kg (170 lb)   LMP 09/05/2016   BMI 28.29 kg/m  No intake/output data recorded. No intake/output data recorded.  FHT:  140, +accels, mod variabiltiy, does not pick up with sitting directly up so monitor had to be readjusted. UC: q 2-4 mins, mod to palp SVE:   Dilation: 7 Effacement (%): 80 Station: -1 Exam by:: MBS Vitals:   06/13/17 0437 06/13/17 0516  BP: 113/73 130/84  Pulse: 84 81  Resp:    Temp:     Labs: Lab Results  Component Value Date   WBC 14.9 (H) 06/13/2017   HGB 12.4 06/13/2017   HCT 38.6 06/13/2017   MCV 82.6 06/13/2017   PLT 205 06/13/2017    Assessment / Plan: A:1. IUP at 40 1/7 weeks 2. GBS neg 3. +MJ hx and ETOH hx P: Antic SVD 2. SROM x 11 hours 3. Will notify the nsy of MJ use.   Labor: active  Preeclampsia:  None  Fetal Wellbeing: reassuring  Pain Control: none  Anticipated MOD:  SVD  Sharee Pimplearon W Wealthy Danielski 06/13/2017, 5:28 AM

## 2017-06-13 NOTE — Progress Notes (Signed)
Pt arrived to [facility] via EMS transport. States she is having painful labor contractions for past couple hours, coming every 3-4 mins, thinks water is broke yesterday evening about 1800, after she was seen in triage, ctx coming 3-4 mins, feeling pressure. Cervix when last seen in triage yesterday, 2 cm dilated. GBS neg. Confirms +FM, denies spotting or vag bleeding.   Nitrazine pos  SVE per M. Allyne GeeSanders, RN. 7/80/-1, pt tolerating exam.
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
## 2017-06-14 LAB — CBC
HCT: 36.1 % (ref 35.0–47.0)
HEMOGLOBIN: 11.6 g/dL — AB (ref 12.0–16.0)
MCH: 26.5 pg (ref 26.0–34.0)
MCHC: 32 g/dL (ref 32.0–36.0)
MCV: 82.9 fL (ref 80.0–100.0)
PLATELETS: 185 10*3/uL (ref 150–440)
RBC: 4.36 MIL/uL (ref 3.80–5.20)
RDW: 13.3 % (ref 11.5–14.5)
WBC: 11.8 10*3/uL — ABNORMAL HIGH (ref 3.6–11.0)

## 2017-06-14 NOTE — Progress Notes (Signed)
Post Partum Day 1 Subjective: no complaints  Objective: Blood pressure 100/60, pulse 70, temperature 98 F (36.7 C), temperature source Oral, resp. rate 18, height 5\' 5"  (1.651 m), weight 77.1 kg (170 lb), last menstrual period 09/05/2016, SpO2 98 %, unknown if currently breastfeeding.  Physical Exam:  General: alert and cooperative Lochia: appropriate Uterine Fundus: firm Incision: n/a DVT Evaluation: No evidence of DVT seen on physical exam.  Recent Labs    06/13/17 0435 06/14/17 0510  HGB 12.4 11.6*  HCT 38.6 36.1    Assessment/Plan: Plan for discharge tomorrow   LOS: 1 day   Stephanie Cantrell 06/14/2017, 2:33 PM
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
## 2017-06-14 NOTE — Discharge Instructions (Signed)

## 2017-06-15 LAB — HIV ANTIBODY (ROUTINE TESTING W REFLEX): HIV SCREEN 4TH GENERATION: NONREACTIVE

## 2017-06-15 LAB — RPR: RPR: NONREACTIVE

## 2017-06-15 MED ORDER — MEDROXYPROGESTERONE ACETATE 150 MG/ML IM SUSP
150.0000 mg | Freq: Once | INTRAMUSCULAR | Status: AC
  Administered 2017-06-15: 150 mg via INTRAMUSCULAR
  Filled 2017-06-15: qty 1

## 2017-06-15 NOTE — Progress Notes (Signed)
Pt discharged with infant.  Discharge instructions, prescriptions and follow up appointment given to and reviewed with pt. Pt verbalized understanding. Escorted out by auxillary. 

## 2017-06-25 NOTE — Discharge Summary (Signed)
Obstetric Discharge Summary Reason for Admission: labor S/S Prenatal Procedures: [facility] US Intrapartum Procedures:  Postpartum Procedures:None Complications-Operative and Postpartum:None Hemoglobin  Date Value Ref Range Status  06/14/2017 11.6 (L) 12.0 - 16.0 g/dL Final   HCT  Date Value Ref Range Status  06/14/2017 36.1 35.0 - 47.0 % Final    Physical Exam:  General: alert, cooperative and appears stated age 46Lochia: N/A Uterine Fundus: FF and lochia mod Incision: None  DVT Evaluation:Neg Homans   Discharge Diagnoses: Term pregnancy with NSVD  Discharge Information: Date: 06/25/2017 Activity: pelvic rest Diet: routine Medications: PNV Condition: stable Instructions: home, FU with Stephanie Cantrell, CNM in 4-6 weeks Discharge to: Home, No sex x 6 weeks, no driving x 2 week Follow-up Information    Stephanie PimpleJones, Caron W, CNM. Schedule an appointment as soon as possible for a visit in 6 week(s).   Specialty:  Obstetrics and Gynecology Why:  For post-partum follow up visit. Contact information: [address] [facility] [place] [place] [id number] [phone]         See M. Haviland's DC summary on same date  Newborn Data: Live born female  Birth Weight: 7 lb 15.3 oz (3610 g) APGAR: 8, 9  Newborn Delivery   Birth date/time:  06/13/2017 05:46:00 Delivery type:  Vaginal, Spontaneous     Home with mother.  Stephanie Pimplearon W Jones 06/25/2017, 1:38 AM

## 2017-07-12 NOTE — Discharge Summary (Signed)
Patient's last menstrual period was 09/05/2016. EDC: Estimated Date of Delivery: 06/12/17 EGA: 7055w1d  Patient presented for evaluation of labor.  Patient had cervical exam by RN and this was reported to me. I reviewed her vital signs and fetal tracing, both of which were reassuring.  Patient was discharged as she was not laboring.  NST interpretation: Reactive.  Ranae Plumberhelsea Ward, MD Attending Obstetrician and Gynecologist [facility]

## 2022-02-01 ENCOUNTER — Emergency Department
Admission: EM | Admit: 2022-02-01 | Discharge: 2022-02-01 | Disposition: A | Discharge status: Home / Self Care | Payer: Medicaid Other | Attending: Emergency Medicine | Admitting: Emergency Medicine

## 2022-02-01 ENCOUNTER — Emergency Department: Payer: Medicaid Other

## 2022-02-01 ENCOUNTER — Other Ambulatory Visit: Payer: Self-pay

## 2022-02-01 ENCOUNTER — Encounter: Payer: Self-pay | Admitting: Emergency Medicine

## 2022-02-01 DIAGNOSIS — M79661 Pain in right lower leg: Secondary | ICD-10-CM | POA: Diagnosis not present

## 2022-02-01 DIAGNOSIS — M79604 Pain in right leg: Secondary | ICD-10-CM | POA: Diagnosis present

## 2022-02-01 MED ORDER — METHOCARBAMOL 500 MG PO TABS: 500.0000 mg | ORAL_TABLET | Freq: Three times a day (TID) | ORAL | 0 refills | Status: DC

## 2022-02-01 MED ORDER — METHOCARBAMOL 500 MG PO TABS: 500.0000 mg | ORAL_TABLET | Freq: Three times a day (TID) | ORAL | 0 refills | Status: AC

## 2022-02-01 NOTE — Discharge Instructions (Signed)
Your x-ray does not show any broken bones or lesions.  I have prescribed a mild muscle relaxer that should help since the meloxicam has not.  Follow up with orthopedics if not improving over the week.

## 2022-02-01 NOTE — ED Triage Notes (Signed)
Pt reports pain to her right lower le for several months and has had x-rays and been to UC but would like to get a MRI to see what is wrong.
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
